# Patient Record
Sex: Female | Born: 1935 | Race: White | Hispanic: No
Health system: Southern US, Community
[De-identification: ages and names within clinical notes are randomized; demographics above are authoritative.]

## PROBLEM LIST (undated history)

## (undated) DIAGNOSIS — I10 Essential (primary) hypertension: Secondary | ICD-10-CM

## (undated) DIAGNOSIS — E785 Hyperlipidemia, unspecified: Secondary | ICD-10-CM

## (undated) DIAGNOSIS — F0280 Dementia in other diseases classified elsewhere without behavioral disturbance: Secondary | ICD-10-CM

## (undated) DIAGNOSIS — G309 Alzheimer's disease, unspecified: Secondary | ICD-10-CM

## (undated) DIAGNOSIS — K219 Gastro-esophageal reflux disease without esophagitis: Secondary | ICD-10-CM

## (undated) DIAGNOSIS — F32A Depression, unspecified: Secondary | ICD-10-CM

## (undated) DIAGNOSIS — F0390 Unspecified dementia without behavioral disturbance: Secondary | ICD-10-CM

## (undated) DIAGNOSIS — F329 Major depressive disorder, single episode, unspecified: Secondary | ICD-10-CM

---

## 2003-03-04 ENCOUNTER — Other Ambulatory Visit: Payer: Self-pay

## 2003-09-02 ENCOUNTER — Other Ambulatory Visit: Payer: Self-pay

## 2004-11-21 ENCOUNTER — Other Ambulatory Visit: Payer: Self-pay

## 2004-11-28 ENCOUNTER — Inpatient Hospital Stay: Payer: Self-pay | Admitting: General Practice

## 2004-12-02 ENCOUNTER — Encounter: Payer: Self-pay | Admitting: Internal Medicine

## 2004-12-29 ENCOUNTER — Encounter: Payer: Self-pay | Admitting: General Practice

## 2005-01-08 ENCOUNTER — Encounter: Payer: Self-pay | Admitting: General Practice

## 2005-02-08 ENCOUNTER — Encounter: Payer: Self-pay | Admitting: General Practice

## 2005-03-16 ENCOUNTER — Ambulatory Visit: Payer: Self-pay | Admitting: General Surgery

## 2005-03-20 ENCOUNTER — Inpatient Hospital Stay: Payer: Self-pay | Admitting: Internal Medicine

## 2005-04-30 ENCOUNTER — Emergency Department: Payer: Self-pay | Admitting: Unknown Physician Specialty

## 2005-06-26 ENCOUNTER — Ambulatory Visit: Payer: Self-pay | Admitting: Gastroenterology

## 2005-07-12 ENCOUNTER — Other Ambulatory Visit: Payer: Self-pay

## 2005-07-25 ENCOUNTER — Inpatient Hospital Stay: Payer: Self-pay | Admitting: General Practice

## 2006-03-20 ENCOUNTER — Ambulatory Visit: Payer: Self-pay | Admitting: General Surgery

## 2006-07-25 ENCOUNTER — Ambulatory Visit: Payer: Self-pay | Admitting: Gastroenterology

## 2006-10-24 ENCOUNTER — Ambulatory Visit: Payer: Self-pay | Admitting: Internal Medicine

## 2007-03-27 ENCOUNTER — Ambulatory Visit: Payer: Self-pay | Admitting: General Surgery

## 2007-05-02 ENCOUNTER — Ambulatory Visit: Payer: Self-pay | Admitting: Internal Medicine

## 2007-07-06 ENCOUNTER — Emergency Department: Payer: Self-pay | Admitting: Emergency Medicine

## 2008-02-18 ENCOUNTER — Ambulatory Visit: Payer: Self-pay | Admitting: Neurology

## 2008-04-28 ENCOUNTER — Ambulatory Visit: Payer: Self-pay | Admitting: General Surgery

## 2009-04-29 ENCOUNTER — Ambulatory Visit: Payer: Self-pay | Admitting: General Surgery

## 2010-05-02 ENCOUNTER — Ambulatory Visit: Payer: Self-pay | Admitting: General Surgery

## 2011-05-04 ENCOUNTER — Ambulatory Visit: Payer: Self-pay | Admitting: General Surgery

## 2012-07-03 ENCOUNTER — Ambulatory Visit: Payer: Self-pay | Admitting: Internal Medicine

## 2012-08-26 ENCOUNTER — Ambulatory Visit: Payer: Self-pay | Admitting: Internal Medicine

## 2012-09-05 ENCOUNTER — Ambulatory Visit: Payer: Self-pay | Admitting: Internal Medicine

## 2012-09-05 LAB — CREATININE, SERUM
Creatinine: 0.82 mg/dL (ref 0.60–1.30)
EGFR (African American): >60
EGFR (Non-African Amer.): >60

## 2013-06-14 ENCOUNTER — Emergency Department: Payer: Self-pay | Admitting: Emergency Medicine

## 2015-04-26 ENCOUNTER — Emergency Department: Payer: Medicare Other

## 2015-04-26 ENCOUNTER — Encounter: Payer: Self-pay | Admitting: Emergency Medicine

## 2015-04-26 ENCOUNTER — Emergency Department
Admission: EM | Admit: 2015-04-26 | Discharge: 2015-04-26 | Disposition: A | Discharge status: Home / Self Care | Payer: Medicare Other | Attending: Emergency Medicine | Admitting: Emergency Medicine

## 2015-04-26 DIAGNOSIS — I10 Essential (primary) hypertension: Secondary | ICD-10-CM | POA: Insufficient documentation

## 2015-04-26 DIAGNOSIS — Y9389 Activity, other specified: Secondary | ICD-10-CM | POA: Insufficient documentation

## 2015-04-26 DIAGNOSIS — G309 Alzheimer's disease, unspecified: Secondary | ICD-10-CM | POA: Insufficient documentation

## 2015-04-26 DIAGNOSIS — W01198A Fall on same level from slipping, tripping and stumbling with subsequent striking against other object, initial encounter: Secondary | ICD-10-CM | POA: Insufficient documentation

## 2015-04-26 DIAGNOSIS — F0280 Dementia in other diseases classified elsewhere without behavioral disturbance: Secondary | ICD-10-CM | POA: Diagnosis not present

## 2015-04-26 DIAGNOSIS — Y998 Other external cause status: Secondary | ICD-10-CM | POA: Diagnosis not present

## 2015-04-26 DIAGNOSIS — Z91040 Latex allergy status: Secondary | ICD-10-CM | POA: Diagnosis not present

## 2015-04-26 DIAGNOSIS — S0990XA Unspecified injury of head, initial encounter: Secondary | ICD-10-CM

## 2015-04-26 DIAGNOSIS — S0081XA Abrasion of other part of head, initial encounter: Secondary | ICD-10-CM | POA: Insufficient documentation

## 2015-04-26 DIAGNOSIS — Y92128 Other place in nursing home as the place of occurrence of the external cause: Secondary | ICD-10-CM | POA: Diagnosis not present

## 2015-04-26 HISTORY — DX: Depression, unspecified: F32.A

## 2015-04-26 HISTORY — DX: Gastro-esophageal reflux disease without esophagitis: K21.9

## 2015-04-26 HISTORY — DX: Hyperlipidemia, unspecified: E78.5

## 2015-04-26 HISTORY — DX: Unspecified dementia, unspecified severity, without behavioral disturbance, psychotic disturbance, mood disturbance, and anxiety: F03.90

## 2015-04-26 HISTORY — DX: Alzheimer's disease, unspecified: G30.9

## 2015-04-26 HISTORY — DX: Essential (primary) hypertension: I10

## 2015-04-26 HISTORY — DX: Major depressive disorder, single episode, unspecified: F32.9

## 2015-04-26 HISTORY — DX: Dementia in other diseases classified elsewhere, unspecified severity, without behavioral disturbance, psychotic disturbance, mood disturbance, and anxiety: F02.80

## 2015-04-26 MED ORDER — HALOPERIDOL LACTATE 5 MG/ML IJ SOLN
5.0000 mg | Freq: Once | INTRAMUSCULAR | Status: DC
  Filled 2015-04-26: qty 1

## 2015-04-26 NOTE — ED Notes (Signed)
Patient to CT Scan via stretcher.  Awake and alert.

## 2015-04-26 NOTE — ED Provider Notes (Signed)
Time Seen: Approximately *1440 I have reviewed the triage notes  Chief Complaint: Fall   History of Present Illness: Rebecca LaoShirley A Hoogendoorn is a 80 y.o. female who presents after a non-syncopal fall. Patient apparently was sitting in the day and and the patient fell likely backwards in a chair and struck the back of her head. The patient had no loss of consciousness and has a significant history of Alzheimer's dementia and is at her baseline behavior. She was joined here by her caretaker and family and advised that her mental status also to them appears at baseline. Patient's not able to exhibit or localize any physical pain. She has noticed abrasion over the back of her head but no other significant injuries on cursory exam   Past Medical History  Diagnosis Date  . Alzheimer's dementia   . Dementia   . Hypertension   . Hyperlipidemia   . GERD (gastroesophageal reflux disease)   . Depression     There are no active problems to display for this patient.   No past surgical history on file.  No past surgical history on file.  No current outpatient prescriptions on file.  Allergies:  Ambien; Lactose intolerance (gi); and Latex  Family History: No family history on file.  Social History: Social History  Substance Use Topics  . Smoking status: Unknown If Ever Smoked  . Smokeless tobacco: Not on file  . Alcohol Use: No     Review of Systems:   10 point review of systems was performed and was otherwise negative: Review of systems was acquired from the patient's family, medical record, and nursing facility Constitutional: No fever Eyes: No visual disturbances ENT: No sore throat, ear pain Cardiac: No chest pain Respiratory: No shortness of breath, wheezing, or stridor Abdomen: No abdominal pain, no vomiting, No diarrhea Endocrine: No weight loss, No night sweats Extremities: No peripheral edema, cyanosis Skin: No rashes, easy bruising Neurologic: No focal weakness, trouble  with speech or swollowing Urologic: No dysuria, Hematuria, or urinary frequency   Physical Exam:  ED Triage Vitals  Enc Vitals Group     BP 04/26/15 1410 118/94 mmHg     Pulse Rate 04/26/15 1410 65     Resp 04/26/15 1410 18     Temp 04/26/15 1410 96.9 F (36.1 C)     Temp Source 04/26/15 1410 Axillary     SpO2 04/26/15 1410 94 %     Weight 04/26/15 1410 120 lb (54.432 kg)     Height 04/26/15 1410 5\' 4"  (1.626 m)     Head Cir --      Peak Flow --      Pain Score --      Pain Loc --      Pain Edu? --      Excl. in GC? --     General: Awake , Alert , and Oriented times 3; GCS 15 Head: Normal cephalic , atraumatic. There is an abrasion over the back of the head with no crepitus or step-off noted or active bleeding at this time Eyes: Pupils equal , round, reactive to light Nose/Throat: No nasal drainage, patent upper airway without erythema or exudate.  Neck: Supple, Full range of motion, No anterior adenopathy or palpable thyroid masses Lungs: Clear to ascultation without wheezes , rhonchi, or rales Heart: Regular rate, regular rhythm without murmurs , gallops , or rubs Abdomen: Soft, non tender without rebound, guarding , or rigidity; bowel sounds positive and symmetric in all 4 quadrants.  No organomegaly .        Extremities: 2 plus symmetric pulses. No edema, clubbing or cyanosis Neurologic: normal ambulation, Motor symmetric without deficits, sensory intact Skin: warm, dry, no rashes    Radiology:   EXAM: CT HEAD WITHOUT CONTRAST  TECHNIQUE: Contiguous axial images were obtained from the base of the skull through the vertex without intravenous contrast.  COMPARISON: 06/14/2013  FINDINGS: Generalized atrophy.  Mild ex vacuo dilatation of the ventricular system.  No midline shift or mass effect.  Small vessel chronic ischemic changes of deep cerebral white matter.  No intracranial hemorrhage, mass lesion, or evidence acute infarction.  No extra-axial fluid  collections.  Fluid versus mucus within RIGHT maxillary sinus.  Remaining visualized paranasal sinuses and mastoid air cells clear.  Bones appear demineralized.  Skull intact.  Focal soft tissue swelling at high posterior LEFT parietal region with foci of soft tissue gas consistent with history of laceration.  IMPRESSION: Atrophy with small vessel chronic ischemic changes of deep cerebral white matter.  No acute intracranial abnormalities.       I personally reviewed the radiologic studies    ED Course: Patient's stay was uneventful on her head CT showed no significant findings of intracranial trauma such as subdural or epidural hematoma. Patient has a mild acute closed head injury and did not require any sutures or any debridement. The patient's son states he can take her back to the nursing facility. They've been advised to return here for condition worsens in especially her mental status    Assessment: * Acute closed head injury  Final Clinical Impression:   Final diagnoses:  Head trauma, initial encounter     Plan: * Outpatient management Patient was advised to return immediately if condition worsens. Patient was advised to follow up with their primary care physician or other specialized physicians involved in their outpatient care             Jennye Moccasin, MD 04/26/15 [phone]

## 2015-04-26 NOTE — ED Notes (Signed)
ARrives from [facility].  S/P unwitnessed fall in the day room  Staff assisted patient to the chair and noticed a laceration to back of head.  Patient has history of Alzheimer Dementia and baseline patient frequently yells out and spits.

## 2015-04-26 NOTE — ED Notes (Signed)
Awake and alert.  NAD.  Skin warm and dry.  D/C back to facility with son.  Report called to facility and discharge papers sent with patient and son.

## 2015-04-26 NOTE — ED Notes (Signed)
Return from CT Scan via stretcher.  

## 2015-04-26 NOTE — Discharge Instructions (Signed)
Concussion, Adult °A concussion, or closed-head injury, is a brain injury caused by a direct blow to the head or by a quick and sudden movement (jolt) of the head or neck. Concussions are usually not life-threatening. Even so, the effects of a concussion can be serious. If you have had a concussion before, you are more likely to experience concussion-like symptoms after a direct blow to the head.  °CAUSES °· Direct blow to the head, such as from running into another player during a soccer game, being hit in a fight, or hitting your head on a hard surface. °· A jolt of the head or neck that causes the brain to move back and forth inside the skull, such as in a car crash. °SIGNS AND SYMPTOMS °The signs of a concussion can be hard to notice. Early on, they may be missed by you, family members, and health care providers. You may look fine but act or feel differently. °Symptoms are usually temporary, but they may last for days, weeks, or even longer. Some symptoms may appear right away while others may not show up for hours or days. Every head injury is different. Symptoms include: °· Mild to moderate headaches that will not go away. °· A feeling of pressure inside your head. °· Having more trouble than usual: °¨ Learning or remembering things you have heard. °¨ Answering questions. °¨ Paying attention or concentrating. °¨ Organizing daily tasks. °¨ Making decisions and solving problems. °· Slowness in thinking, acting or reacting, speaking, or reading. °· Getting lost or being easily confused. °· Feeling tired all the time or lacking energy (fatigued). °· Feeling drowsy. °· Sleep disturbances. °¨ Sleeping more than usual. °¨ Sleeping less than usual. °¨ Trouble falling asleep. °¨ Trouble sleeping (insomnia). °· Loss of balance or feeling lightheaded or dizzy. °· Nausea or vomiting. °· Numbness or tingling. °· Increased sensitivity to: °¨ Sounds. °¨ Lights. °¨ Distractions. °· Vision problems or eyes that tire  easily. °· Diminished sense of taste or smell. °· Ringing in the ears. °· Mood changes such as feeling sad or anxious. °· Becoming easily irritated or angry for little or no reason. °· Lack of motivation. °· Seeing or hearing things other people do not see or hear (hallucinations). °DIAGNOSIS °Your health care provider can usually diagnose a concussion based on a description of your injury and symptoms. He or she will ask whether you passed out (lost consciousness) and whether you are having trouble remembering events that happened right before and during your injury. °Your evaluation might include: °· A brain scan to look for signs of injury to the brain. Even if the test shows no injury, you may still have a concussion. °· Blood tests to be sure other problems are not present. °TREATMENT °· Concussions are usually treated in an emergency department, in urgent care, or at a clinic. You may need to stay in the hospital overnight for further treatment. °· Tell your health care provider if you are taking any medicines, including prescription medicines, over-the-counter medicines, and natural remedies. Some medicines, such as blood thinners (anticoagulants) and aspirin, may increase the chance of complications. Also tell your health care provider whether you have had alcohol or are taking illegal drugs. This information may affect treatment. °· Your health care provider will send you home with important instructions to follow. °· How fast you will recover from a concussion depends on many factors. These factors include how severe your concussion is, what part of your brain was injured,   your age, and how healthy you were before the concussion. °· Most people with mild injuries recover fully. Recovery can take time. In general, recovery is slower in older persons. Also, persons who have had a concussion in the past or have other medical problems may find that it takes longer to recover from their current injury. °HOME  CARE INSTRUCTIONS °General Instructions °· Carefully follow the directions your health care provider gave you. °· Only take over-the-counter or prescription medicines for pain, discomfort, or fever as directed by your health care provider. °· Take only those medicines that your health care provider has approved. °· Do not drink alcohol until your health care provider says you are well enough to do so. Alcohol and certain other drugs may slow your recovery and can put you at risk of further injury. °· If it is harder than usual to remember things, write them down. °· If you are easily distracted, try to do one thing at a time. For example, do not try to watch TV while fixing dinner. °· Talk with family members or close friends when making important decisions. °· Keep all follow-up appointments. Repeated evaluation of your symptoms is recommended for your recovery. °· Watch your symptoms and tell others to do the same. Complications sometimes occur after a concussion. Older adults with a brain injury may have a higher risk of serious complications, such as a blood clot on the brain. °· Tell your teachers, school nurse, school counselor, coach, athletic trainer, or work manager about your injury, symptoms, and restrictions. Tell them about what you can or cannot do. They should watch for: °¨ Increased problems with attention or concentration. °¨ Increased difficulty remembering or learning new information. °¨ Increased time needed to complete tasks or assignments. °¨ Increased irritability or decreased ability to cope with stress. °¨ Increased symptoms. °· Rest. Rest helps the brain to heal. Make sure you: °¨ Get plenty of sleep at night. Avoid staying up late at night. °¨ Keep the same bedtime hours on weekends and weekdays. °¨ Rest during the day. Take daytime naps or rest breaks when you feel tired. °· Limit activities that require a lot of thought or concentration. These include: °¨ Doing homework or job-related  work. °¨ Watching TV. °¨ Working on the computer. °· Avoid any situation where there is potential for another head injury (football, hockey, soccer, basketball, martial arts, downhill snow sports and horseback riding). Your condition will get worse every time you experience a concussion. You should avoid these activities until you are evaluated by the appropriate follow-up health care providers. °Returning To Your Regular Activities °You will need to return to your normal activities slowly, not all at once. You must give your body and brain enough time for recovery. °· Do not return to sports or other athletic activities until your health care provider tells you it is safe to do so. °· Ask your health care provider when you can drive, ride a bicycle, or operate heavy machinery. Your ability to react may be slower after a brain injury. Never do these activities if you are dizzy. °· Ask your health care provider about when you can return to work or school. °Preventing Another Concussion °It is very important to avoid another brain injury, especially before you have recovered. In rare cases, another injury can lead to permanent brain damage, brain swelling, or death. The risk of this is greatest during the first 7-10 days after a head injury. Avoid injuries by: °· Wearing a   seat belt when riding in a car. °· Drinking alcohol only in moderation. °· Wearing a helmet when biking, skiing, skateboarding, skating, or doing similar activities. °· Avoiding activities that could lead to a second concussion, such as contact or recreational sports, until your health care provider says it is okay. °· Taking safety measures in your home. °¨ Remove clutter and tripping hazards from floors and stairways. °¨ Use grab bars in bathrooms and handrails by stairs. °¨ Place non-slip mats on floors and in bathtubs. °¨ Improve lighting in dim areas. °SEEK MEDICAL CARE IF: °· You have increased problems paying attention or  concentrating. °· You have increased difficulty remembering or learning new information. °· You need more time to complete tasks or assignments than before. °· You have increased irritability or decreased ability to cope with stress. °· You have more symptoms than before. °Seek medical care if you have any of the following symptoms for more than 2 weeks after your injury: °· Lasting (chronic) headaches. °· Dizziness or balance problems. °· Nausea. °· Vision problems. °· Increased sensitivity to noise or light. °· Depression or mood swings. °· Anxiety or irritability. °· Memory problems. °· Difficulty concentrating or paying attention. °· Sleep problems. °· Feeling tired all the time. °SEEK IMMEDIATE MEDICAL CARE IF: °· You have severe or worsening headaches. These may be a sign of a blood clot in the brain. °· You have weakness (even if only in one hand, leg, or part of the face). °· You have numbness. °· You have decreased coordination. °· You vomit repeatedly. °· You have increased sleepiness. °· One pupil is larger than the other. °· You have convulsions. °· You have slurred speech. °· You have increased confusion. This may be a sign of a blood clot in the brain. °· You have increased restlessness, agitation, or irritability. °· You are unable to recognize people or places. °· You have neck pain. °· It is difficult to wake you up. °· You have unusual behavior changes. °· You lose consciousness. °MAKE SURE YOU: °· Understand these instructions. °· Will watch your condition. °· Will get help right away if you are not doing well or get worse. °  °This information is not intended to replace advice given to you by your health care provider. Make sure you discuss any questions you have with your health care provider. °  °Document Released: 06/17/2003 Document Revised: 04/17/2014 Document Reviewed: 10/17/2012 °[employer] Interactive Patient Education ©2016 [employer] ° °Please return immediately if condition worsens.  Please contact her primary physician or the physician you were given for referral. If you have any specialist physicians involved in her treatment and plan please also contact them. Thank you for using [facility]. ° °

## 2015-04-26 NOTE — ED Notes (Signed)
Awake and alert. NAD 

## 2015-04-26 NOTE — ED Notes (Signed)
Quarter sized abrasion area to posterior had.  Bleeding controlled.  +1 swelling to area.

## 2015-07-27 ENCOUNTER — Emergency Department
Admission: EM | Admit: 2015-07-27 | Discharge: 2015-07-27 | Disposition: A | Discharge status: Home / Self Care | Payer: Medicare Other | Attending: Emergency Medicine | Admitting: Emergency Medicine

## 2015-07-27 ENCOUNTER — Encounter: Payer: Self-pay | Admitting: Emergency Medicine

## 2015-07-27 ENCOUNTER — Emergency Department: Payer: Medicare Other

## 2015-07-27 DIAGNOSIS — Z23 Encounter for immunization: Secondary | ICD-10-CM | POA: Insufficient documentation

## 2015-07-27 DIAGNOSIS — Z7982 Long term (current) use of aspirin: Secondary | ICD-10-CM | POA: Diagnosis not present

## 2015-07-27 DIAGNOSIS — Y929 Unspecified place or not applicable: Secondary | ICD-10-CM | POA: Diagnosis not present

## 2015-07-27 DIAGNOSIS — E785 Hyperlipidemia, unspecified: Secondary | ICD-10-CM | POA: Insufficient documentation

## 2015-07-27 DIAGNOSIS — I10 Essential (primary) hypertension: Secondary | ICD-10-CM | POA: Diagnosis not present

## 2015-07-27 DIAGNOSIS — Y999 Unspecified external cause status: Secondary | ICD-10-CM | POA: Diagnosis not present

## 2015-07-27 DIAGNOSIS — S0181XA Laceration without foreign body of other part of head, initial encounter: Secondary | ICD-10-CM | POA: Insufficient documentation

## 2015-07-27 DIAGNOSIS — Y939 Activity, unspecified: Secondary | ICD-10-CM | POA: Diagnosis not present

## 2015-07-27 DIAGNOSIS — F0280 Dementia in other diseases classified elsewhere without behavioral disturbance: Secondary | ICD-10-CM | POA: Diagnosis not present

## 2015-07-27 DIAGNOSIS — F329 Major depressive disorder, single episode, unspecified: Secondary | ICD-10-CM | POA: Diagnosis not present

## 2015-07-27 DIAGNOSIS — G309 Alzheimer's disease, unspecified: Secondary | ICD-10-CM | POA: Insufficient documentation

## 2015-07-27 DIAGNOSIS — W19XXXA Unspecified fall, initial encounter: Secondary | ICD-10-CM | POA: Diagnosis not present

## 2015-07-27 MED ORDER — TETANUS-DIPHTH-ACELL PERTUSSIS 5-2.5-18.5 LF-MCG/0.5 IM SUSP
0.5000 mL | Freq: Once | INTRAMUSCULAR | Status: AC
  Administered 2015-07-27: 0.5 mL via INTRAMUSCULAR
  Filled 2015-07-27: qty 0.5

## 2015-07-27 MED ORDER — LIDOCAINE HCL (PF) 1 % IJ SOLN
INTRAMUSCULAR | Status: AC
  Administered 2015-07-27: 5 mL
  Filled 2015-07-27: qty 5

## 2015-07-27 NOTE — Discharge Instructions (Signed)
The forehead laceration was repaired using 3 absorbable sutures.  These will dissolve and fall out on their own in the next 1-2 weeks.  Facial Laceration  A facial laceration is a cut on the face. These injuries can be painful and cause bleeding. Lacerations usually heal quickly, but they need special care to reduce scarring. DIAGNOSIS  Your health care provider will take a medical history, ask for details about how the injury occurred, and examine the wound to determine how deep the cut is. TREATMENT  Some facial lacerations may not require closure. Others may not be able to be closed because of an increased risk of infection. The risk of infection and the chance for successful closure will depend on various factors, including the amount of time since the injury occurred. The wound may be cleaned to help prevent infection. If closure is appropriate, pain medicines may be given if needed. Your health care provider will use stitches (sutures), wound glue (adhesive), or skin adhesive strips to repair the laceration. These tools bring the skin edges together to allow for faster healing and a better cosmetic outcome. If needed, you may also be given a tetanus shot. HOME CARE INSTRUCTIONS  Only take over-the-counter or prescription medicines as directed by your health care provider.  Follow your health care provider's instructions for wound care. These instructions will vary depending on the technique used for closing the wound. For Sutures:  Keep the wound clean and dry.   If you were given a bandage (dressing), you should change it at least once a day. Also change the dressing if it becomes wet or dirty, or as directed by your health care provider.   Wash the wound with soap and water 2 times a day. Rinse the wound off with water to remove all soap. Pat the wound dry with a clean towel.   After cleaning, apply a thin layer of the antibiotic ointment recommended by your health care provider.  This will help prevent infection and keep the dressing from sticking.   You may shower as usual after the first 24 hours. Do not soak the wound in water until the sutures are removed.   Get your sutures removed as directed by your health care provider. With facial lacerations, sutures should usually be taken out after 4-5 days to avoid stitch marks.   Wait a few days after your sutures are removed before applying any makeup. For Skin Adhesive Strips:  Keep the wound clean and dry.   Do not get the skin adhesive strips wet. You may bathe carefully, using caution to keep the wound dry.   If the wound gets wet, pat it dry with a clean towel.   Skin adhesive strips will fall off on their own. You may trim the strips as the wound heals. Do not remove skin adhesive strips that are still stuck to the wound. They will fall off in time.  For Wound Adhesive:  You may briefly wet your wound in the shower or bath. Do not soak or scrub the wound. Do not swim. Avoid periods of heavy sweating until the skin adhesive has fallen off on its own. After showering or bathing, gently pat the wound dry with a clean towel.   Do not apply liquid medicine, cream medicine, ointment medicine, or makeup to your wound while the skin adhesive is in place. This may loosen the film before your wound is healed.   If a dressing is placed over the wound, be careful not  to apply tape directly over the skin adhesive. This may cause the adhesive to be pulled off before the wound is healed.   Avoid prolonged exposure to sunlight or tanning lamps while the skin adhesive is in place.  The skin adhesive will usually remain in place for 5-10 days, then naturally fall off the skin. Do not pick at the adhesive film.  After Healing: Once the wound has healed, cover the wound with sunscreen during the day for 1 full year. This can help minimize scarring. Exposure to ultraviolet light in the first year will darken the scar.  It can take 1-2 years for the scar to lose its redness and to heal completely.  SEEK MEDICAL CARE IF:  You have a fever. SEEK IMMEDIATE MEDICAL CARE IF:  You have redness, pain, or swelling around the wound.   You see ayellowish-white fluid (pus) coming from the wound.    This information is not intended to replace advice given to you by your health care provider. Make sure you discuss any questions you have with your health care provider.   Document Released: 05/04/2004 Document Revised: 04/17/2014 Document Reviewed: 11/07/2012 Elsevier Interactive Patient Education 2016 [employer]  Head Injury, Adult You have a head injury. Headaches and throwing up (vomiting) are common after a head injury. It should be easy to wake up from sleeping. Sometimes you must stay in the hospital. Most problems happen within the first 24 hours. Side effects may occur up to 7-10 days after the injury.  WHAT ARE THE TYPES OF HEAD INJURIES? Head injuries can be as minor as a bump. Some head injuries can be more severe. More severe head injuries include:  A jarring injury to the brain (concussion).  A bruise of the brain (contusion). This mean there is bleeding in the brain that can cause swelling.  A cracked skull (skull fracture).  Bleeding in the brain that collects, clots, and forms a bump (hematoma). WHEN SHOULD I GET HELP RIGHT AWAY?   You are confused or sleepy.  You cannot be woken up.  You feel sick to your stomach (nauseous) or keep throwing up (vomiting).  Your dizziness or unsteadiness is getting worse.  You have very bad, lasting headaches that are not helped by medicine. Take medicines only as told by your doctor.  You cannot use your arms or legs like normal.  You cannot walk.  You notice changes in the black spots in the center of the colored part of your eye (pupil).  You have clear or bloody fluid coming from your nose or ears.  You have trouble seeing. During the next  24 hours after the injury, you must stay with someone who can watch you. This person should get help right away (call 911 in the [place] if you start to shake and are not able to control it (have seizures), you pass out, or you are unable to wake up. HOW CAN I PREVENT A HEAD INJURY IN THE FUTURE?  Wear seat belts.  Wear a helmet while bike riding and playing sports like football.  Stay away from dangerous activities around the house. WHEN CAN I RETURN TO NORMAL ACTIVITIES AND ATHLETICS? See your doctor before doing these activities. You should not do normal activities or play contact sports until 1 week after the following symptoms have stopped:  Headache that does not go away.  Dizziness.  Poor attention.  Confusion.  Memory problems.  Sickness to your stomach or throwing up.  Tiredness.  Fussiness.  Bothered by bright lights or loud noises.  Anxiousness or depression.  Restless sleep. MAKE SURE YOU:   Understand these instructions.  Will watch your condition.  Will get help right away if you are not doing well or get worse.   This information is not intended to replace advice given to you by your health care provider. Make sure you discuss any questions you have with your health care provider.   Document Released: 03/09/2008 Document Revised: 04/17/2014 Document Reviewed: 12/02/2012 Elsevier Interactive Patient Education 2016 Else[employer] Inc[employer]  Sutured Wound Care Sutures are stitches that can be used to close wounds. Taking care of your wound properly can help to prevent pain and infection. It can also help your wound to heal more quickly. HOW TO CARE FOR YOUR SUTURED WOUND Wound Care  Keep the wound clean and dry.  If you were given a bandage (dressing), you should change it at least once per day or as directed by your health care provider. You should also change it if it becomes wet or dirty.  Keep the wound completely dry for the first 24 hours or as directed by  your health care provider. After that time, you may shower or bathe. However, make sure that the wound is not soaked in water until the sutures have been removed.  Clean the wound one time each day or as directed by your health care provider.  Wash the wound with soap and water.  Rinse the wound with water to remove all soap.  Pat the wound dry with a clean towel. Do not rub the wound.  Aftercleaning the wound, apply a thin layer of antibioticointment as directed by your health care provider. This will help to prevent infection and keep the dressing from sticking to the wound.  Have the sutures removed as directed by your health care provider. General Instructions  Take or apply medicines only as directed by your health care provider.  To help prevent scarring, make sure to cover your wound with sunscreen whenever you are outside after the sutures are removed and the wound is healed. Make sure to wear a sunscreen of at least 30 SPF.  If you were prescribed an antibiotic medicine or ointment, finish all of it even if you start to feel better.  Do not scratch or pick at the wound.  Keep all follow-up visits as directed by your health care provider. This is important.  Check your wound every day for signs of infection. Watch for:   Redness, swelling, or pain.  Fluid, blood, or pus.  Raise (elevate) the injured area above the level of your heart while you are sitting or lying down, if possible.  Avoid stretching your wound.  Drink enough fluids to keep your urine clear or pale yellow. SEEK MEDICAL CARE IF:  You received a tetanus shot and you have swelling, severe pain, redness, or bleeding at the injection site.  You have a fever.  A wound that was closed breaks open.  You notice a bad smell coming from the wound.  You notice something coming out of the wound, such as wood or glass.  Your pain is not controlled with medicine.  You have increased redness, swelling, or  pain at the site of your wound.  You have fluid, blood, or pus coming from your wound.  You notice a change in the color of your skin near your wound.  You need to change the dressing frequently due to fluid, blood, or pus draining from  the wound.  You develop a new rash.  You develop numbness around the wound. SEEK IMMEDIATE MEDICAL CARE IF:  You develop severe swelling around the injury site.  Your pain suddenly increases and is severe.  You develop painful lumps near the wound or on skin that is anywhere on your body.  You have a red streak going away from your wound.  The wound is on your hand or foot and you cannot properly move a finger or toe.  The wound is on your hand or foot and you notice that your fingers or toes look pale or bluish.   This information is not intended to replace advice given to you by your health care provider. Make sure you discuss any questions you have with your health care provider.   Document Released: 05/04/2004 Document Revised: 08/11/2014 Document Reviewed: 11/06/2012 Elsevier Interactive Patient Education [employer]

## 2015-07-27 NOTE — ED Provider Notes (Signed)
[facility] Emergency Department Provider Note  ____________________________________________  Time seen: 1:35 AM  I have reviewed the triage vital signs and the nursing notes.   HISTORY  Chief Complaint Fall  Level 5 caveat:  Portions of the history and physical were unable to be obtained due to the patient's chronic dementia  HPI Rebecca Stewart is a 80 y.o. female sent to the ED from [facility] after an unwitnessed fall. The patient had placed a washcloth over the laceration herself by the time staff there arrived. Then to the ED for evaluation. Patient denies any complaints. No chest pain shortness of breath abdominal pain back pain and vomiting fevers chills or dizziness. Does complain of some pain in the area right now. No neck pain.   per EMS report from nursing home, at usual mental status baseline.   Past Medical History  Diagnosis Date  . Alzheimer's dementia   . Dementia   . Hypertension   . Hyperlipidemia   . GERD (gastroesophageal reflux disease)   . Depression      There are no active problems to display for this patient.    History reviewed. No pertinent past surgical history.   Current Outpatient Rx  Name  Route  Sig  Dispense  Refill  . aspirin (ASPIRIN CHILDRENS) 81 MG chewable tablet   Oral   Chew 1 tablet by mouth daily.         . divalproex (DEPAKOTE SPRINKLE) 125 MG capsule   Oral   Take 1 capsule by mouth 2 (two) times daily.         Marland Kitchen. donepezil (ARICEPT) 10 MG tablet   Oral   Take 1 tablet by mouth daily.         . memantine (NAMENDA) 10 MG tablet   Oral   Take 1 tablet by mouth 2 (two) times daily.         . simvastatin (ZOCOR) 20 MG tablet   Oral   Take 1 tablet by mouth daily.            Allergies Ambien; Lactose intolerance (gi); and Latex   History reviewed. No pertinent family history.  Social History Social History  Substance Use Topics  . Smoking status: Unknown If  Ever Smoked  . Smokeless tobacco: None  . Alcohol Use: No    Review of Systems  Constitutional:   No fever or chills.  Eyes:   No vision changes.  Cardiovascular:   No chest pain. Respiratory:   No dyspnea or cough. Gastrointestinal:   Negative for abdominal pain, vomiting and diarrhea.  No bloody stool. Musculoskeletal:   Negative for focal pain or swelling Neurological:   Positive for left forehead pain 10-point ROS otherwise negative.  ____________________________________________   PHYSICAL EXAM:  VITAL SIGNS: ED Triage Vitals  Enc Vitals Group     BP 07/27/15 0138 127/66 mmHg     Pulse Rate 07/27/15 0138 71     Resp 07/27/15 0138 15     Temp 07/27/15 0138 98 F (36.7 C)     Temp Source 07/27/15 0138 Oral     SpO2 07/27/15 0138 99 %     Weight 07/27/15 0138 112 lb (50.803 kg)     Height 07/27/15 0138 5\' 1"  (1.549 m)     Head Cir --      Peak Flow --      Pain Score --      Pain Loc --  Pain Edu? --      Excl. in GC? --     Vital signs reviewed, nursing assessments reviewed.   Constitutional:   Alert and orientedTo self. Well appearing and in no distress. Eyes:   No scleral icterus. No conjunctival pallor. PERRL. EOMI ENT   Head:   Normocephalic with a 3 cm curvilinear laceration over the left forehead, currently hemostatic. No bony tenderness or step-off.   Nose:   No congestion/rhinnorhea. No septal hematoma   Mouth/Throat:   MMM, no pharyngeal erythema. No peritonsillar mass.    Neck:   No stridor. No SubQ emphysema. No meningismus. No midline spinal tenderness, normal range of motion. Hematological/Lymphatic/Immunilogical:   No cervical lymphadenopathy. Cardiovascular:   RRR. Symmetric bilateral radial and DP pulses.  No murmurs.  Respiratory:   Normal respiratory effort without tachypnea nor retractions. Breath sounds are clear and equal bilaterally. No wheezes/rales/rhonchi. Gastrointestinal:   Soft and nontender. Non distended. There is  no CVA tenderness.  No rebound, rigidity, or guarding. Genitourinary:   deferred Musculoskeletal:   Nontender with normal range of motion in all extremities. No joint effusions.  No lower extremity tenderness.  No edema. Neurologic:   Nonverbal CN 2-10 normal. Motor grossly intact. No gross focal neurologic deficits are appreciated.  Skin:    Skin is warm, dry with above noted laceration over the left forehead. Otherwise intact. No rash noted.  No petechiae, purpura, or bullae.  ____________________________________________    LABS (pertinent positives/negatives) (all labs ordered are listed, but only abnormal results are displayed) Labs Reviewed - No data to display ____________________________________________   EKG  Interpreted by me  Date: 07/27/2015  Rate: 66  Rhythm: normal sinus rhythm  QRS Axis: normal  Intervals: normal  ST/T Wave abnormalities: normal  Conduction Disutrbances: none  Narrative Interpretation: unremarkable      ____________________________________________    RADIOLOGY  CT head unremarkable CT cervical spine unremarkable  ____________________________________________   PROCEDURES LACERATION REPAIR Performed by: Scotty Court, Shakeita Vandevander Authorized by: Sharman Cheek Consent: Verbal consent obtained. Risks and benefits: risks, benefits and alternatives were discussed Consent given by: patient Patient identity confirmed: provided demographic data Prepped and Draped in normal sterile fashion Wound explored  Laceration Location: Left forehead  Laceration Length: 3cm  No Foreign Bodies seen or palpated  Anesthesia: local infiltration  Local anesthetic: lidocaine 1% without epinephrine  Anesthetic total: 3 ml  Irrigation method: syringe Amount of cleaning: standard  Skin closure: 5-0 Monocryl   Number of sutures: 3  Technique: Simple interrupted   Patient tolerance: Patient tolerated the procedure well with no immediate  complications.   ____________________________________________   INITIAL IMPRESSION / ASSESSMENT AND PLAN / ED COURSE  Pertinent labs & imaging results that were available during my care of the patient were reviewed by me and considered in my medical decision making (see chart for details).  Patient presents with laceration to left forehead after an unwitnessed fall causing blunt trauma injury. Patient is behaving at her normal vent mental status according to the nursing home report to EMS. No other acute changes, calm and not in distress. CT negative, tdap given. Wound repaired. Follow up with primary care.     ____________________________________________   FINAL CLINICAL IMPRESSION(S) / ED DIAGNOSES  Final diagnoses:  Forehead laceration, initial encounter       Portions of this note were generated with dragon dictation software. Dictation errors may occur despite best attempts at proofreading.   Sharman Cheek, MD 07/27/15 [phone]

## 2015-07-27 NOTE — ED Notes (Signed)
Patient transported to CT 

## 2015-07-27 NOTE — ED Notes (Signed)
Pt returned from CT °

## 2015-07-27 NOTE — ED Notes (Signed)
Pt went to CT

## 2015-07-27 NOTE — ED Notes (Signed)
Pt arrived from [facility] unit by EMS, post unwitnessed fall. EMS reports when they arrived to the facility, facility staff was holding blood soaked washcloth to left forehead laceration. Staff reported to EMS that pt was at baseline.

## 2015-10-04 ENCOUNTER — Emergency Department: Payer: Medicare Other

## 2015-10-04 ENCOUNTER — Emergency Department
Admission: EM | Admit: 2015-10-04 | Discharge: 2015-10-04 | Disposition: A | Discharge status: Home / Self Care | Payer: Medicare Other | Attending: Emergency Medicine | Admitting: Emergency Medicine

## 2015-10-04 DIAGNOSIS — Z7982 Long term (current) use of aspirin: Secondary | ICD-10-CM | POA: Diagnosis not present

## 2015-10-04 DIAGNOSIS — G309 Alzheimer's disease, unspecified: Secondary | ICD-10-CM | POA: Insufficient documentation

## 2015-10-04 DIAGNOSIS — Z048 Encounter for examination and observation for other specified reasons: Secondary | ICD-10-CM | POA: Insufficient documentation

## 2015-10-04 DIAGNOSIS — F329 Major depressive disorder, single episode, unspecified: Secondary | ICD-10-CM | POA: Insufficient documentation

## 2015-10-04 DIAGNOSIS — F0281 Dementia in other diseases classified elsewhere with behavioral disturbance: Secondary | ICD-10-CM | POA: Insufficient documentation

## 2015-10-04 DIAGNOSIS — E785 Hyperlipidemia, unspecified: Secondary | ICD-10-CM | POA: Insufficient documentation

## 2015-10-04 DIAGNOSIS — Z91040 Latex allergy status: Secondary | ICD-10-CM | POA: Diagnosis not present

## 2015-10-04 DIAGNOSIS — I10 Essential (primary) hypertension: Secondary | ICD-10-CM | POA: Diagnosis not present

## 2015-10-04 DIAGNOSIS — Z79899 Other long term (current) drug therapy: Secondary | ICD-10-CM | POA: Insufficient documentation

## 2015-10-04 DIAGNOSIS — W19XXXA Unspecified fall, initial encounter: Secondary | ICD-10-CM

## 2015-10-04 NOTE — ED Notes (Signed)
Patient to ED via EMS for a fall. Patient is a resident of [facility] and is a nonverbal dementia patient. Patient's baseline is such that she just makes noises and unable to communicate her needs or wants. Patient without obvious injury from this fall. EMS reports she fell and possibly struck her head although no obvious injuries are noted.

## 2015-10-04 NOTE — ED Provider Notes (Addendum)
[facility] Emergency Department Provider Note  ____________________________________________    I have reviewed the triage vital signs and the nursing notes.   HISTORY  Chief Complaint Fall  History limited by Alzheimer's dementia  HPI Rebecca Stewart is a 80 y.o. female who presents after a reported fall. Patient is unable to provide history given her severe dementia. Son reports the patient has had nearly 30 falls over the last year. Patient was sent to the ED today because she was found down in  another resident's room with a possible head injury.     Past Medical History  Diagnosis Date  . Alzheimer's dementia   . Dementia   . Hypertension   . Hyperlipidemia   . GERD (gastroesophageal reflux disease)   . Depression     There are no active problems to display for this patient.   History reviewed. No pertinent past surgical history.  Current Outpatient Rx  Name  Route  Sig  Dispense  Refill  . aspirin (ASPIRIN CHILDRENS) 81 MG chewable tablet   Oral   Chew 1 tablet by mouth daily.         . divalproex (DEPAKOTE SPRINKLE) 125 MG capsule   Oral   Take 1 capsule by mouth 2 (two) times daily.         Marland Kitchen. donepezil (ARICEPT) 10 MG tablet   Oral   Take 1 tablet by mouth daily.         . memantine (NAMENDA) 10 MG tablet   Oral   Take 1 tablet by mouth 2 (two) times daily.         . simvastatin (ZOCOR) 20 MG tablet   Oral   Take 1 tablet by mouth daily.           Allergies Ambien; Lactose intolerance (gi); and Latex  No family history on file.  Social History Social History  Substance Use Topics  . Smoking status: Unknown If Ever Smoked  . Smokeless tobacco: None  . Alcohol Use: No    Level V caveat: Unable to obtain Review of Systems due to dementia     ____________________________________________   PHYSICAL EXAM:  VITAL SIGNS: ED Triage Vitals  Enc Vitals Group     BP 10/04/15 0637 91/61 mmHg     Pulse  Rate 10/04/15 0637 65     Resp 10/04/15 0637 20     Temp 10/04/15 0652 98.1 F (36.7 C)     Temp Source 10/04/15 0652 Oral     SpO2 10/04/15 0637 100 %     Weight 10/04/15 0637 120 lb (54.432 kg)     Height 10/04/15 0637 5\' 2"  (1.575 m)     Head Cir --      Peak Flow --      Pain Score --      Pain Loc --      Pain Edu? --      Excl. in GC? --      Constitutional: Alert. Well appearing and in no distress.  Eyes: Conjunctivae are normal. No erythema or injection ENT   Head: Normocephalic. Minor abrasion left anterior forehead no significant swelling   Mouth/Throat: Mucous membranes are moist. Cardiovascular: Normal rate, regular rhythm. Normal and symmetric distal pulses are present in the upper extremities. Respiratory: Normal respiratory effort without tachypnea nor retractions.   Gastrointestinal: Soft and non-tender in all quadrants. No distention. There is no CVA tenderness. Genitourinary: deferred Musculoskeletal: Nontender with normal range of motion  in all extremities. No lower extremity tenderness nor edema. No pain with axial load of both lower extremities, no pelvic tenderness to palpation. Patient may have mild tenderness to palpation of cervical vertebrae although difficult to determine given dementia Neurologic:  Normal speech and language. No gross focal neurologic deficits are appreciated. Skin:  Skin is warm, dry and intact. No rash noted. Psychiatric: Mood and affect are normal. Patient exhibits appropriate insight and judgment.  ____________________________________________    LABS (pertinent positives/negatives)  Labs Reviewed - No data to display  ____________________________________________   EKG  ED ECG REPORT I, Jene EveryKINNER, Atara Paterson, the attending physician, personally viewed and interpreted this ECG.  Date: 10/04/2015 EKG Time: 6:51 AM Rate: 73 Rhythm: normal sinus rhythm QRS Axis: normal Intervals: normal ST/T Wave abnormalities:  nonspecific Conduction Disturbances: none Narrative Interpretation: unremarkable   ____________________________________________    RADIOLOGY    ____________________________________________   PROCEDURES  Procedure(s) performed: none  Critical Care performed: none  ____________________________________________   INITIAL IMPRESSION / ASSESSMENT AND PLAN / ED COURSE  Pertinent labs & imaging results that were available during my care of the patient were reviewed by me and considered in my medical decision making (see chart for details).  Patient with Alzheimer's dementia presents after a fall. Overall her exam is reassuring although possible mild cervical or numbness, we will obtain CT head and cervical spine and reevaluate  ----------------------------------------- 8:55 AM on 10/04/2015 -----------------------------------------  Unable to obtain images given patient movement. Discussed with son, he is comfortable foregoing images given the patient appears to be at her baseline. On reexam she is moving her neck without discomfort  ____________________________________________   FINAL CLINICAL IMPRESSION(S) / ED DIAGNOSES  Final diagnoses:  Fall, initial encounter          Jene Everyobert Boruch Manuele, MD 10/04/15 [phone]  Jene Everyobert Avis Tirone, MD 10/04/15 0900

## 2015-10-04 NOTE — ED Notes (Signed)
Pt going back to nursing home wth son

## 2015-10-04 NOTE — ED Notes (Signed)
Pt shook head yes when asked if she needed to void; assisted x 2 to toilet in room; pt did not void; incontinent of urine; cleaned and dry attends placed on pt; pt hollering; trying to get out of bed by putting legs over the rails; safety sitter at bedside

## 2015-10-16 ENCOUNTER — Emergency Department: Payer: Medicare Other

## 2015-10-16 ENCOUNTER — Encounter: Payer: Self-pay | Admitting: Emergency Medicine

## 2015-10-16 ENCOUNTER — Emergency Department
Admission: EM | Admit: 2015-10-16 | Discharge: 2015-10-16 | Disposition: A | Discharge status: Home / Self Care | Payer: Medicare Other | Attending: Emergency Medicine | Admitting: Emergency Medicine

## 2015-10-16 DIAGNOSIS — S0990XA Unspecified injury of head, initial encounter: Secondary | ICD-10-CM | POA: Insufficient documentation

## 2015-10-16 DIAGNOSIS — Z7982 Long term (current) use of aspirin: Secondary | ICD-10-CM | POA: Diagnosis not present

## 2015-10-16 DIAGNOSIS — I10 Essential (primary) hypertension: Secondary | ICD-10-CM | POA: Diagnosis not present

## 2015-10-16 DIAGNOSIS — Y939 Activity, unspecified: Secondary | ICD-10-CM | POA: Diagnosis not present

## 2015-10-16 DIAGNOSIS — G309 Alzheimer's disease, unspecified: Secondary | ICD-10-CM | POA: Insufficient documentation

## 2015-10-16 DIAGNOSIS — Z79899 Other long term (current) drug therapy: Secondary | ICD-10-CM | POA: Diagnosis not present

## 2015-10-16 DIAGNOSIS — W0110XA Fall on same level from slipping, tripping and stumbling with subsequent striking against unspecified object, initial encounter: Secondary | ICD-10-CM | POA: Insufficient documentation

## 2015-10-16 DIAGNOSIS — Z91040 Latex allergy status: Secondary | ICD-10-CM | POA: Diagnosis not present

## 2015-10-16 DIAGNOSIS — W19XXXA Unspecified fall, initial encounter: Secondary | ICD-10-CM

## 2015-10-16 DIAGNOSIS — F329 Major depressive disorder, single episode, unspecified: Secondary | ICD-10-CM | POA: Insufficient documentation

## 2015-10-16 DIAGNOSIS — Y999 Unspecified external cause status: Secondary | ICD-10-CM | POA: Insufficient documentation

## 2015-10-16 DIAGNOSIS — Y92009 Unspecified place in unspecified non-institutional (private) residence as the place of occurrence of the external cause: Secondary | ICD-10-CM | POA: Diagnosis not present

## 2015-10-16 DIAGNOSIS — E785 Hyperlipidemia, unspecified: Secondary | ICD-10-CM | POA: Diagnosis not present

## 2015-10-16 MED ORDER — LORAZEPAM 2 MG/ML IJ SOLN
1.0000 mg | Freq: Once | INTRAMUSCULAR | Status: AC
  Administered 2015-10-16: 1 mg via INTRAVENOUS
  Filled 2015-10-16: qty 1

## 2015-10-16 NOTE — ED Provider Notes (Signed)
[facility] Emergency Department Provider Note  ____________________________________________  Time seen: Seen upon arrival to the emergency department  I have reviewed the triage vital signs and the nursing notes.   HISTORY  Chief Complaint Fall   HPI Rebecca Stewart is a 80 y.o. female with a history of Alzheimer's dementia as well as hypertensionpresenting to the emergency department today after witnessed fall. Per EMS the staff witnessed the patient tripping and falling. She did hit her head. There was no loss of consciousness. The patient has been at her baseline mental status ever since which is agitated. EMS said that she had spit as well as swung at them en route. The patient's history is very limited by the patient's dementia. Her son is at the bedside and says that she has had multiple falls. Henderson's plan and agrees with plan for CAT scan.   Past Medical History  Diagnosis Date  . Alzheimer's dementia   . Dementia   . Hypertension   . Hyperlipidemia   . GERD (gastroesophageal reflux disease)   . Depression     There are no active problems to display for this patient.   History reviewed. No pertinent past surgical history.  Current Outpatient Rx  Name  Route  Sig  Dispense  Refill  . aspirin (ASPIRIN CHILDRENS) 81 MG chewable tablet   Oral   Chew 1 tablet by mouth daily.         . divalproex (DEPAKOTE SPRINKLE) 125 MG capsule   Oral   Take 1 capsule by mouth 2 (two) times daily.         Marland Kitchen donepezil (ARICEPT) 10 MG tablet   Oral   Take 1 tablet by mouth daily.         . memantine (NAMENDA) 10 MG tablet   Oral   Take 1 tablet by mouth 2 (two) times daily.         . simvastatin (ZOCOR) 20 MG tablet   Oral   Take 1 tablet by mouth daily.           Allergies Ambien; Lactose intolerance (gi); and Latex  No family history on file.  Social History Social History  Substance Use Topics  . Smoking status: Unknown  If Ever Smoked  . Smokeless tobacco: None  . Alcohol Use: No    Review of Systems Caveat secondary to patient with baseline dementia.  ____________________________________________   PHYSICAL EXAM:  VITAL SIGNS: ED Triage Vitals  Enc Vitals Group     BP 10/16/15 1518 198/83 mmHg     Pulse Rate 10/16/15 1518 53     Resp 10/16/15 1518 20     Temp 10/16/15 1518 98.8 F (37.1 C)     Temp Source 10/16/15 1518 Oral     SpO2 10/16/15 1518 99 %     Weight 10/16/15 1518 120 lb (54.432 kg)     Height 10/16/15 1518  (1.651 m)     Head Cir --      Peak Flow --      Pain Score --      Pain Loc --      Pain Edu? --      Excl. in GC? --     Constitutional: Alert.  Agitated. Yelling at staff. Eyes: Conjunctivae are normal. PERRL. EOMI. Head: Atraumatic. Nose: No congestion/rhinnorhea. Mouth/Throat: Mucous membranes are moist.   Neck: No stridor.  No tenderness to the midline C-spine. No deformity or step-off. Ranges head  and neck freely without any apparent pain or restriction. Cardiovascular: Normal rate, regular rhythm. Grossly normal heart sounds.  Respiratory: Normal respiratory effort.  No retractions. Lungs CTAB. Gastrointestinal: Soft and nontender. No distention No CVA tenderness. Musculoskeletal: No lower extremity tenderness nor edema.  No joint effusions. No tenderness to the hips bilaterally. Pelvis is stable. No bruising to the abdomen or chest. No crepitus to the thorax. The thoracic as well as lumbar spines are nontender and there is no deformity or step-off. Neurologic:  Normal speech and language. No gross focal neurologic deficits are appreciated.  Skin:  Skin is warm, dry and intact. No rash noted. Psychiatric: Mood and affect are normal. Speech and behavior are normal.  ____________________________________________   LABS (all labs ordered are listed, but only abnormal results are displayed)  Labs Reviewed - No data to  display ____________________________________________  EKG  ____________________________________________  RADIOLOGY   CT Head Wo Contrast (Final result) Result time: 10/16/15 16:35:06   Final result by Rad Results In Interface (10/16/15 16:35:06)   Narrative:   CLINICAL DATA: Fall.  EXAM: CT HEAD WITHOUT CONTRAST  TECHNIQUE: Contiguous axial images were obtained from the base of the skull through the vertex without intravenous contrast.  COMPARISON: 07/27/2015 head CT.  FINDINGS: No evidence of parenchymal hemorrhage or extra-axial fluid collection. No mass lesion, mass effect, or midline shift.  No CT evidence of acute infarction. Intracranial atherosclerosis. Nonspecific stable moderate subcortical and periventricular white matter hypodensity, most in keeping with chronic small vessel ischemic change.  Generalized cerebral volume loss. Cerebral ventricle sizes are stable and concordant with the degree of cerebral volume loss.  The visualized paranasal sinuses are essentially clear. The mastoid air cells are unopacified. No evidence of calvarial fracture.  IMPRESSION: 1. No evidence of acute intracranial abnormality. No evidence of calvarial fracture. 2. Generalized cerebral volume loss and moderate chronic small vessel ischemia.   Electronically Signed By: Delbert PhenixJason A Poff M.D. On: 10/16/2015 16:35       ____________________________________________   PROCEDURES   Procedures  ____________________________________________   INITIAL IMPRESSION / ASSESSMENT AND PLAN / ED COURSE  Pertinent labs & imaging results that were available during my care of the patient were reviewed by me and considered in my medical decision making (see chart for details).  ----------------------------------------- 5:52 PM on 10/16/2015 -----------------------------------------  Patient is calm and cooperative after Ativan. Reassuring head CT. Will be discharged  home. Son is the bedside And he is aware of the CAT scan results. He says that he will be able to transport the patient himself. ____________________________________________   FINAL CLINICAL IMPRESSION(S) / ED DIAGNOSES  Mechanical fall. Head injury.    NEW MEDICATIONS STARTED DURING THIS VISIT:  New Prescriptions   No medications on file     Note:  This document was prepared using Dragon voice recognition software and may include unintentional dictation errors.    Myrna Blazeravid Matthew Gagandeep Pettet, MD 10/16/15 [phone]

## 2015-10-16 NOTE — ED Notes (Signed)
Pt arrived alert , confused and spitting on ems.  Reports staff at care home witnessed pt fall.  Denies loc. States pts behavior is at baseline.

## 2015-11-23 ENCOUNTER — Encounter: Payer: Self-pay | Admitting: Emergency Medicine

## 2015-11-23 ENCOUNTER — Emergency Department
Admission: EM | Admit: 2015-11-23 | Discharge: 2015-11-23 | Disposition: A | Discharge status: Home / Self Care | Payer: Medicare Other | Attending: Emergency Medicine | Admitting: Emergency Medicine

## 2015-11-23 ENCOUNTER — Emergency Department: Payer: Medicare Other

## 2015-11-23 DIAGNOSIS — S0990XA Unspecified injury of head, initial encounter: Secondary | ICD-10-CM

## 2015-11-23 DIAGNOSIS — Y929 Unspecified place or not applicable: Secondary | ICD-10-CM | POA: Insufficient documentation

## 2015-11-23 DIAGNOSIS — I10 Essential (primary) hypertension: Secondary | ICD-10-CM | POA: Diagnosis not present

## 2015-11-23 DIAGNOSIS — W1839XA Other fall on same level, initial encounter: Secondary | ICD-10-CM | POA: Diagnosis not present

## 2015-11-23 DIAGNOSIS — Y999 Unspecified external cause status: Secondary | ICD-10-CM | POA: Diagnosis not present

## 2015-11-23 DIAGNOSIS — Y939 Activity, unspecified: Secondary | ICD-10-CM | POA: Insufficient documentation

## 2015-11-23 DIAGNOSIS — Z7982 Long term (current) use of aspirin: Secondary | ICD-10-CM | POA: Insufficient documentation

## 2015-11-23 DIAGNOSIS — S0083XA Contusion of other part of head, initial encounter: Secondary | ICD-10-CM | POA: Insufficient documentation

## 2015-11-23 MED ORDER — LORAZEPAM 2 MG/ML IJ SOLN
INTRAMUSCULAR | Status: AC
  Administered 2015-11-23: 1 mg via INTRAVENOUS
  Filled 2015-11-23: qty 1

## 2015-11-23 MED ORDER — LORAZEPAM 2 MG/ML IJ SOLN
1.0000 mg | Freq: Once | INTRAMUSCULAR | Status: AC
  Administered 2015-11-23: 1 mg via INTRAVENOUS

## 2015-11-23 NOTE — ED Notes (Signed)
Patient transported to CT 

## 2015-11-23 NOTE — ED Triage Notes (Signed)
Pt arrived via ems from [facility] from a fall where pt hit right forehead on a rug. Pt was ambulatory for ems and walked to stretcher. Pt has a history of dementia and ems report from facility of being non verbal. Small area to right forehead with small amount of controlled bleeding.

## 2015-11-23 NOTE — ED Notes (Signed)
Awaiting for MD to assess Pt.

## 2015-11-23 NOTE — ED Notes (Signed)
MD at bedside. Sons at the bedside.

## 2015-11-23 NOTE — Discharge Instructions (Signed)
Please return immediately if condition worsens. Please contact her primary physician or the physician you were given for referral. If you have any specialist physicians involved in her treatment and plan please also contact them. Thank you for using [facility]. ° °

## 2015-11-23 NOTE — ED Notes (Addendum)
Pt yelling out thrashing around will not stay still to get Head CT Md ordering meds to help relax her to be able to get CT. SON aware and at the bedside. Pt noted to have small abrasion on right side of forehead. Pt noted to have no ROM issues.

## 2015-11-24 NOTE — ED Provider Notes (Signed)
Time Seen: Approximately 2052  I have reviewed the triage notes  Chief Complaint: Fall   History of Present Illness: Rebecca Stewart is a 80 y.o. female who was transported here by ambulance from [facility]. Patient had a non-syncopal fall and landed on a rug. There was no loss of consciousness. She has a mild abrasion above her right eye with no active bleeding at this time. According to her son  she is at her baseline mental status.   Past Medical History:  Diagnosis Date  . Alzheimer's dementia   . Dementia   . Depression   . GERD (gastroesophageal reflux disease)   . Hyperlipidemia   . Hypertension     There are no active problems to display for this patient.   No past surgical history on file.  No past surgical history on file.  Current Outpatient Rx  . Order #: [id number] Class: Historical Med  . Order #: [id number] Class: Historical Med  . Order #: [id number] Class: Historical Med  . Order #: [id number] Class: Historical Med  . Order #: [id number] Class: Historical Med    Allergies:  Ambien [zolpidem tartrate]; Lactose intolerance (gi); and Latex  Family History: No family history on file.  Social History: Social History  Substance Use Topics  . Smoking status: Unknown If Ever Smoked  . Smokeless tobacco: Not on file  . Alcohol use No     Review of Systems:   10 point review of systems was performed and was otherwise negative: Review of systems is taken through her sign, EMS, and previous documentation Constitutional: No fever Eyes: No visual disturbances ENT: No sore throat, ear pain Cardiac: No chest pain Respiratory: No shortness of breath, wheezing, or stridor Abdomen: No abdominal pain, no vomiting, No diarrhea Endocrine: No weight loss, No night sweats Extremities: No peripheral edema, cyanosis Skin: No rashes, easy bruising Neurologic: No focal weakness, trouble with speech or swollowing Urologic: No dysuria, Hematuria, or  urinary frequency No neck pain  Physical Exam:  ED Triage Vitals  Enc Vitals Group     BP 11/23/15 2130 (!) 132/96     Pulse Rate 11/23/15 2056 70     Resp 11/23/15 2056 16     Temp 11/23/15 2056 97.9 F (36.6 C)     Temp Source 11/23/15 2056 Axillary     SpO2 11/23/15 2056 95 %     Weight 11/23/15 2052 120 lb (54.4 kg)     Height 11/23/15 2052 4\' 11"  (1.499 m)     Head Circumference --      Peak Flow --      Pain Score --      Pain Loc --      Pain Edu? --      Excl. in GC? --     General: Awake , AlertTo name only on occasion ,Combative at times, GCS of 15  Head: Small abrasion above her right eye on the forehead region with a small hematoma, no crepitus or step-off is noted  Eyes: Pupils equal , round, reactive to light. Extraocular eye movements are intact Nose/Throat: No nasal drainage, patent upper airway without erythema or exudate.  Neck: Supple, Full range of motion, no posterior reproducible pain with full range of motion No anterior adenopathy or palpable thyroid masses Lungs: Clear to ascultation without wheezes , rhonchi, or rales Heart: Regular rate, regular rhythm without murmurs , gallops , or rubs Abdomen: Soft, non tender without rebound, guarding , or rigidity; bowel sounds positive and  symmetric in all 4 quadrants. No organomegaly .        Extremities: 2 plus symmetric pulses. No edema, clubbing or cyanosis Neurologic: normal ambulation, Motor symmetric without deficits, sensory intact Skin: warm, dry, no rashes   EKG: * ED ECG REPORT I, Jennye MoccasinBrian S Quigley, the attending physician, personally viewed and interpreted this ECG.  Date: 11/24/2015 EKG Time: 2058 Rate: 75 Rhythm: normal sinus rhythm with a large amount of artifact QRS Axis: normal Intervals: normal ST/T Wave abnormalities: normal Conduction Disturbances: none Narrative Interpretation: unremarkable No acute ischemic changes noted   Radiology:  "Ct Head Wo Contrast  Result Date:  11/23/2015 CLINICAL DATA:  Fall, striking right forehead on a rug. History of dementia. Nonverbal. EXAM: CT HEAD WITHOUT CONTRAST TECHNIQUE: Contiguous axial images were obtained from the base of the skull through the vertex without intravenous contrast. COMPARISON:  10/16/2015 FINDINGS: Brain: Diffuse cerebral atrophy. Ventricular dilatation consistent with central atrophy. Patchy low-attenuation changes in the deep white matter consistent small vessel ischemia. No mass effect or midline shift. No abnormal extra-axial fluid collections. Gray-white matter junctions are distinct. Basal cisterns are not effaced. No evidence of acute intracranial hemorrhage. Vascular: No hyperdense vessel or unexpected calcification. Skull: Calvarium appears intact. Sinuses/Orbits: No acute finding. Other: Congenital nonunion of the posterior arch of C1. IMPRESSION: No acute intracranial abnormalities. Chronic atrophy and small vessel ischemic changes. Electronically Signed   By: Burman NievesWilliam  Stevens M.D.   On: 11/23/2015 22:28  "  * I personally reviewed the radiologic studies     ED Course:  Patient's stay was uneventful and she did not appear to have any significant injury from a non-syncopal fall. Patient be transported back by private vehicle as his son states that he feels confident he can transported uneventfully. She was given Ativan 1 mg IM for sedation so that we could undergo the CAT scan evaluation Clinical Course      Final Clinical Impression: *  Final diagnoses:  Head injury, initial encounter     Plan: * Outpatient Patient was advised to return immediately if condition worsens. Patient was advised to follow up with their primary care physician or other specialized physicians involved in their outpatient care. The patient and/or family member/power of attorney had laboratory results reviewed at the bedside. All questions and concerns were addressed and appropriate discharge instructions were  distributed by the nursing staff.             Jennye MoccasinBrian S Quigley, MD 11/24/15 [phone]

## 2016-01-06 ENCOUNTER — Encounter: Payer: Self-pay | Admitting: Emergency Medicine

## 2016-01-06 ENCOUNTER — Emergency Department
Admission: EM | Admit: 2016-01-06 | Discharge: 2016-01-06 | Disposition: A | Discharge status: Home / Self Care | Payer: Medicare Other | Attending: Emergency Medicine | Admitting: Emergency Medicine

## 2016-01-06 ENCOUNTER — Emergency Department: Payer: Medicare Other

## 2016-01-06 DIAGNOSIS — R4182 Altered mental status, unspecified: Secondary | ICD-10-CM | POA: Insufficient documentation

## 2016-01-06 DIAGNOSIS — Z79899 Other long term (current) drug therapy: Secondary | ICD-10-CM | POA: Diagnosis not present

## 2016-01-06 DIAGNOSIS — F0281 Dementia in other diseases classified elsewhere with behavioral disturbance: Secondary | ICD-10-CM | POA: Diagnosis not present

## 2016-01-06 DIAGNOSIS — Z91040 Latex allergy status: Secondary | ICD-10-CM | POA: Diagnosis not present

## 2016-01-06 DIAGNOSIS — Z7982 Long term (current) use of aspirin: Secondary | ICD-10-CM | POA: Insufficient documentation

## 2016-01-06 DIAGNOSIS — I10 Essential (primary) hypertension: Secondary | ICD-10-CM | POA: Diagnosis not present

## 2016-01-06 DIAGNOSIS — G309 Alzheimer's disease, unspecified: Secondary | ICD-10-CM | POA: Diagnosis not present

## 2016-01-06 DIAGNOSIS — M79602 Pain in left arm: Secondary | ICD-10-CM | POA: Diagnosis present

## 2016-01-06 LAB — URINALYSIS COMPLETE WITH MICROSCOPIC (ARMC ONLY)
BACTERIA UA: NONE SEEN
Bilirubin Urine: NEGATIVE
Glucose, UA: 50 mg/dL — AB
Hgb urine dipstick: NEGATIVE
Leukocytes, UA: NEGATIVE
Nitrite: NEGATIVE
PH: 6 (ref 5.0–8.0)
PROTEIN: NEGATIVE mg/dL
SPECIFIC GRAVITY, URINE: 1.016 (ref 1.005–1.030)
Squamous Epithelial / LPF: NONE SEEN

## 2016-01-06 LAB — BASIC METABOLIC PANEL
Anion gap: 10 (ref 5–15)
BUN: 23 mg/dL — AB (ref 6–20)
CHLORIDE: 106 mmol/L (ref 101–111)
CO2: 24 mmol/L (ref 22–32)
CREATININE: 0.53 mg/dL (ref 0.44–1.00)
Calcium: 9.4 mg/dL (ref 8.9–10.3)
GFR calc Af Amer: >60 mL/min (ref >60)
GFR calc non Af Amer: >60 mL/min (ref >60)
Glucose, Bld: 104 mg/dL — ABNORMAL HIGH (ref 65–99)
Potassium: 3.8 mmol/L (ref 3.5–5.1)
SODIUM: 140 mmol/L (ref 135–145)

## 2016-01-06 LAB — CBC WITH DIFFERENTIAL/PLATELET
Basophils Absolute: 0 10*3/uL (ref 0–0.1)
Basophils Relative: 0 %
EOS ABS: 0 10*3/uL (ref 0–0.7)
EOS PCT: 0 %
HCT: 40 % (ref 35.0–47.0)
HEMOGLOBIN: 14.1 g/dL (ref 12.0–16.0)
LYMPHS ABS: 1.5 10*3/uL (ref 1.0–3.6)
Lymphocytes Relative: 17 %
MCH: 29.4 pg (ref 26.0–34.0)
MCHC: 35.3 g/dL (ref 32.0–36.0)
MCV: 83.3 fL (ref 80.0–100.0)
MONO ABS: 0.6 10*3/uL (ref 0.2–0.9)
MONOS PCT: 7 %
NEUTROS PCT: 76 %
Neutro Abs: 6.7 10*3/uL — ABNORMAL HIGH (ref 1.4–6.5)
Platelets: 310 10*3/uL (ref 150–440)
RBC: 4.8 MIL/uL (ref 3.80–5.20)
RDW: 14.5 % (ref 11.5–14.5)
WBC: 8.8 10*3/uL (ref 3.6–11.0)

## 2016-01-06 LAB — TROPONIN I: Troponin I: <0.03 ng/mL (ref <0.03)

## 2016-01-06 MED ORDER — ACETAMINOPHEN 650 MG RE SUPP
650.0000 mg | Freq: Once | RECTAL | Status: AC
  Administered 2016-01-06: 650 mg via RECTAL
  Filled 2016-01-06: qty 1

## 2016-01-06 MED ORDER — ACETAMINOPHEN 325 MG PO TABS: 650.0000 mg | ORAL_TABLET | Freq: Four times a day (QID) | ORAL | 2 refills | Status: DC | PRN

## 2016-01-06 MED ORDER — ACETAMINOPHEN 325 MG PO TABS: 1000.0000 mg | ORAL_TABLET | Freq: Three times a day (TID) | ORAL | 2 refills | Status: AC

## 2016-01-06 NOTE — ED Provider Notes (Addendum)
[facility] Emergency Department Provider Note  ____________________________________________  Time seen: Approximately 12:53 PM  I have reviewed the triage vital signs and the nursing notes.   HISTORY  Chief Complaint Altered Mental Status  Level 5 caveat:  Portions of the history and physical were unable to be obtained due to dementia   HPI Rebecca Stewart is a 80 y.o. female with a history of advanced Alzheimer's dementia, hypertension, hyperlipidemia who presents for evaluation of altered mental status and left arm pain. According to the facility and patient's son, she woke up yesterday with her arm flexed against her body and since yesterday patient has kept her left arm in the same position and she screams every time the arm is moved. She is also more agitated and not allowing staff to change her over to clean her without becoming very agitated. No known falls or trauma, patient is most bed bound for the last few weeks due to slow decline in her dementia. Patient is unable to provide any history and mumbles and groans and screams which per staff is her baseline. Patient is here with her son also tells me the patient is currently at her baseline at this time other than the contracted left arm.  Past Medical History:  Diagnosis Date  . Alzheimer's dementia   . Dementia   . Depression   . GERD (gastroesophageal reflux disease)   . Hyperlipidemia   . Hypertension     There are no active problems to display for this patient.   History reviewed. No pertinent surgical history.  Prior to Admission medications   Medication Sig Start Date End Date Taking? Authorizing Provider  acetaminophen (TYLENOL) 325 MG tablet Take 3 tablets (975 mg total) by mouth 3 (three) times daily. 01/06/16 01/05/17  Nita Sicklearolina Benjaman Artman, MD  aspirin (ASPIRIN CHILDRENS) 81 MG chewable tablet Chew 1 tablet by mouth daily.    Historical Provider, MD  divalproex (DEPAKOTE SPRINKLE) 125  MG capsule Take 1 capsule by mouth 2 (two) times daily. 07/20/15   Historical Provider, MD  donepezil (ARICEPT) 10 MG tablet Take 1 tablet by mouth daily. 07/20/15   Historical Provider, MD  memantine (NAMENDA) 10 MG tablet Take 1 tablet by mouth 2 (two) times daily. 07/20/15   Historical Provider, MD  simvastatin (ZOCOR) 20 MG tablet Take 1 tablet by mouth daily. 07/20/15   Historical Provider, MD    Allergies Ambien [zolpidem tartrate]; Lactose intolerance (gi); Latex; Morphine and related; and Penicillins  No family history on file.  Social History Social History  Substance Use Topics  . Smoking status: Unknown If Ever Smoked  . Smokeless tobacco: Not on file  . Alcohol use No    Review of Systems + L arm pain Unable to obtain rest due to dementia ____________________________________________   PHYSICAL EXAM:  VITAL SIGNS: ED Triage Vitals [01/06/16 1248]  Enc Vitals Group     BP (!) 152/117     Pulse Rate 69     Resp 20     Temp 97.4 F (36.3 C)     Temp Source Axillary     SpO2 99 %     Weight      Height      Head Circumference      Peak Flow      Pain Score      Pain Loc      Pain Edu?      Excl. in GC?     Constitutional: Patient is  awake and screaming which per son is her baseline. HEENT:      Head: Normocephalic and atraumatic.         Eyes: Conjunctivae are normal. Sclera is non-icteric. PERRL      Mouth/Throat: Mucous membranes are dry.       Neck: Supple with no signs of meningismus. Cardiovascular: Regular rate and rhythm. No murmurs, gallops, or rubs. 2+ symmetrical distal pulses are present in all extremities. No JVD. Respiratory: Normal respiratory effort. Lungs are clear to auscultation bilaterally. No wheezes, crackles, or rhonchi.  Gastrointestinal: Soft, non tender, and non distended with positive bowel sounds. No rebound or guarding. Musculoskeletal: Patient is holding her left arm flexed against her chest wall and will fight me when I try to  extend the arm at the elbow joint. No obvious injuries, deformities, abrasions, swelling, or erythema noted. Neurologic: Face is symmetric, moves all 4 extremities. Does not follow commands Skin: Skin is warm, dry and intact. No rash noted.  ____________________________________________   LABS (all labs ordered are listed, but only abnormal results are displayed)  Labs Reviewed  CBC WITH DIFFERENTIAL/PLATELET - Abnormal; Notable for the following:       Result Value   Neutro Abs 6.7 (*)    All other components within normal limits  BASIC METABOLIC PANEL - Abnormal; Notable for the following:    Glucose, Bld 104 (*)    BUN 23 (*)    All other components within normal limits  URINALYSIS COMPLETEWITH MICROSCOPIC ([facility] ONLY) - Abnormal; Notable for the following:    Color, Urine YELLOW (*)    APPearance CLEAR (*)    Glucose, UA 50 (*)    Ketones, ur 1+ (*)    All other components within normal limits  URINE CULTURE  TROPONIN I   ____________________________________________  EKG  ED ECG REPORT I, Nita Sickle, the attending physician, personally viewed and interpreted this ECG.  Normal sinus rhythm, rate 72, poor quality EKG as patient will not stay still but no obvious STE or depressions seen.  ____________________________________________  RADIOLOGY  XR L arm: No acute fracture or subluxation. Mild degenerative changes ____________________________________________   PROCEDURES  Procedure(s) performed: None Procedures Critical Care performed:  None ____________________________________________   INITIAL IMPRESSION / ASSESSMENT AND PLAN / ED COURSE   80 y.o. female with a history of advanced Alzheimer's dementia, hypertension, hyperlipidemia who presents for evaluation of left arm pain since yesterday. Patient is keeping her arm contracted close to her torso and will fight and scream when we tried extend the arm at the elbow. No obvious injuries, swelling, erythema  of the left upper extremity. She is moving the other 3 extremities and face is symmetric with no obvious evidence of stroke. vital signs are within normal limits with no fever. Since patient is demented and is unable to provide further history will check blood work, urinalysis, EKG, get an x-ray of her left upper extremity to rule out any fractures. According to patient's son patient is at her baseline mental status at this time.  Clinical Course  Comment By Time  Labs, EKG, urinalysis, and x-ray with no acute findings. Patient be discharged back home to her skilled nursing facility. Nita Sickle, MD 09/28 [phone]   Patient is more comfortable and continues to keep arm close to body after tylenol but no longer screaming and allows me to gently move the arm. This is possibly due to MSK pain of the arm as XRs are negative. No evidence of joint effusion  or cellulitis. DC on standing tylenol for pain.  Pertinent labs & imaging results that were available during my care of the patient were reviewed by me and considered in my medical decision making (see chart for details).    ____________________________________________   FINAL CLINICAL IMPRESSION(S) / ED DIAGNOSES  Final diagnoses:  Left arm pain      NEW MEDICATIONS STARTED DURING THIS VISIT:  Current Discharge Medication List    START taking these medications   Details  acetaminophen (TYLENOL) 325 MG tablet Take 3 tablets (975 mg total) by mouth 3 (three) times daily. Qty: 100 tablet, Refills: 2         Note:  This document was prepared using Dragon voice recognition software and may include unintentional dictation errors.    Nita Sickle, MD 01/06/16 1522    Nita Sickle, MD 01/06/16 [phone]

## 2016-01-06 NOTE — Discharge Instructions (Signed)
Give tylenol 1000mg  every 8 hours for arm pain.

## 2016-01-06 NOTE — ED Notes (Signed)
Spoke to pt's daughter-in-law about transport for pt. She is waiting for her husband to come back to answer question since he was saying he wanted to transport her home.

## 2016-01-06 NOTE — ED Triage Notes (Signed)
Pt here from [facility], facility reports pt is currently at baseline, confused and yelling. Facility reports that pt was acting different earlier today, was calm, cooperative and able to answer questions.

## 2016-01-07 LAB — URINE CULTURE: Culture: NO GROWTH

## 2016-02-09 DEATH — deceased

## 2017-05-20 IMAGING — CT CT CERVICAL SPINE W/O CM
1 of 7 series · 3 of 14 positions shown, 4 images · non-contrast
Comparison: CT head 04/26/2015. CT head and cervical spine
06/14/2013.

CLINICAL DATA: Unwitnessed fall. Laceration to the left forehead.
Headache.

EXAM:
CT HEAD WITHOUT CONTRAST
CT CERVICAL SPINE WITHOUT CONTRAST
TECHNIQUE: Multidetector CT imaging of the head and cervical spine was
performed following the standard protocol without intravenous
contrast. Multiplanar CT image reconstructions of the cervical spine
were also generated.

[Series 3: bone · axial · 0.47mm/px · z∈[-198,-30]mm · 3 of 85 slices shown, 4 images]
[im 1/85  soft-tissue]
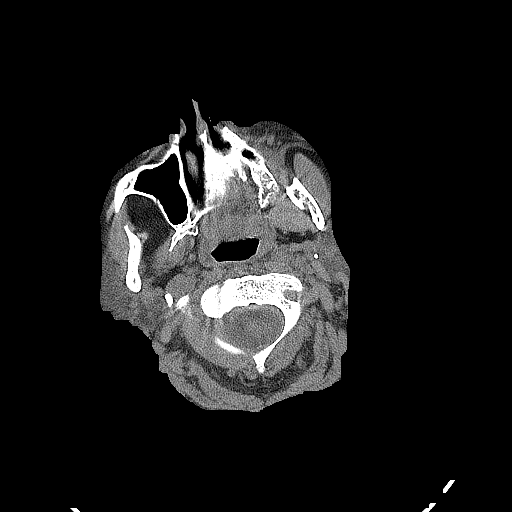
[im 1/85  bone]
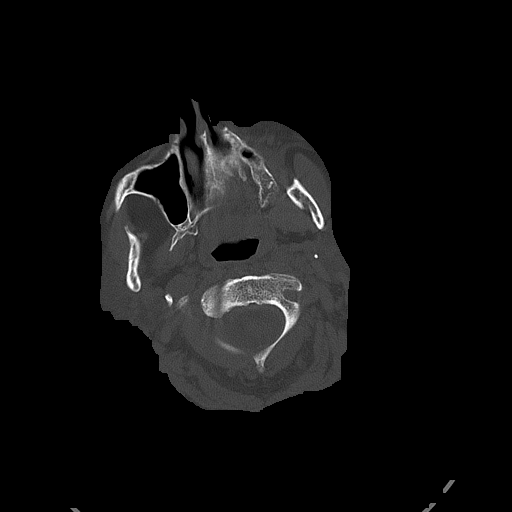
[im 43/85  bone]
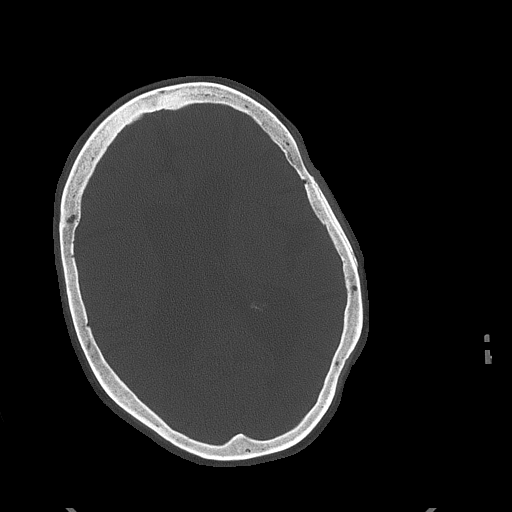
[im 85/85  bone]
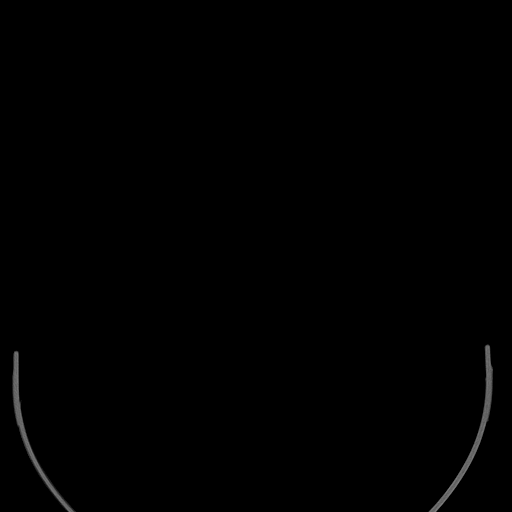

[3 of 14 positions shown; findings below may reference images not displayed]

FINDINGS: CT HEAD FINDINGS

Diffuse cerebral atrophy. Ventricular dilatation consistent with
central atrophy. Low-attenuation changes in the deep white matter
likely representing small vessel ischemia. No mass effect or midline
shift. No abnormal extra-axial fluid collections. Gray-white matter
junctions are distinct. Basal cisterns are not effaced. No evidence
of acute intracranial hemorrhage. No depressed skull fractures.
Visualized paranasal sinuses and mastoid air cells are not
opacified. Vascular calcifications.

CT CERVICAL SPINE FINDINGS

Normal curvature of the cervical spine. Slight retrolisthesis of C2
on C3 with slight anterolisthesis of C7 on T1. Changes are likely
degenerative. There are diffuse degenerative changes throughout
cervical spine with narrowed interspaces and endplate hypertrophic
changes. Degenerative changes throughout the cervical facet joints.
No vertebral compression deformities. C1-2 articulation appears
intact. No focal bone lesion or bone destruction. Congenital
nonunion of the posterior arch of C1. No prevertebral soft tissue
swelling. Soft tissues are unremarkable.
IMPRESSION: No acute intracranial abnormalities. Diffuse atrophy and small
vessel ischemic change.

Degenerative changes in the cervical spine. No acute displaced
fractures identified.

## 2017-10-30 IMAGING — CR DG HUMERUS 2V *L*
2 series · 2 of 2 positions shown · non-contrast
Comparison: 06/14/2013

CLINICAL DATA: Pain, dementia

EXAM:
LEFT HUMERUS - 2+ VIEW

[humerus ap]
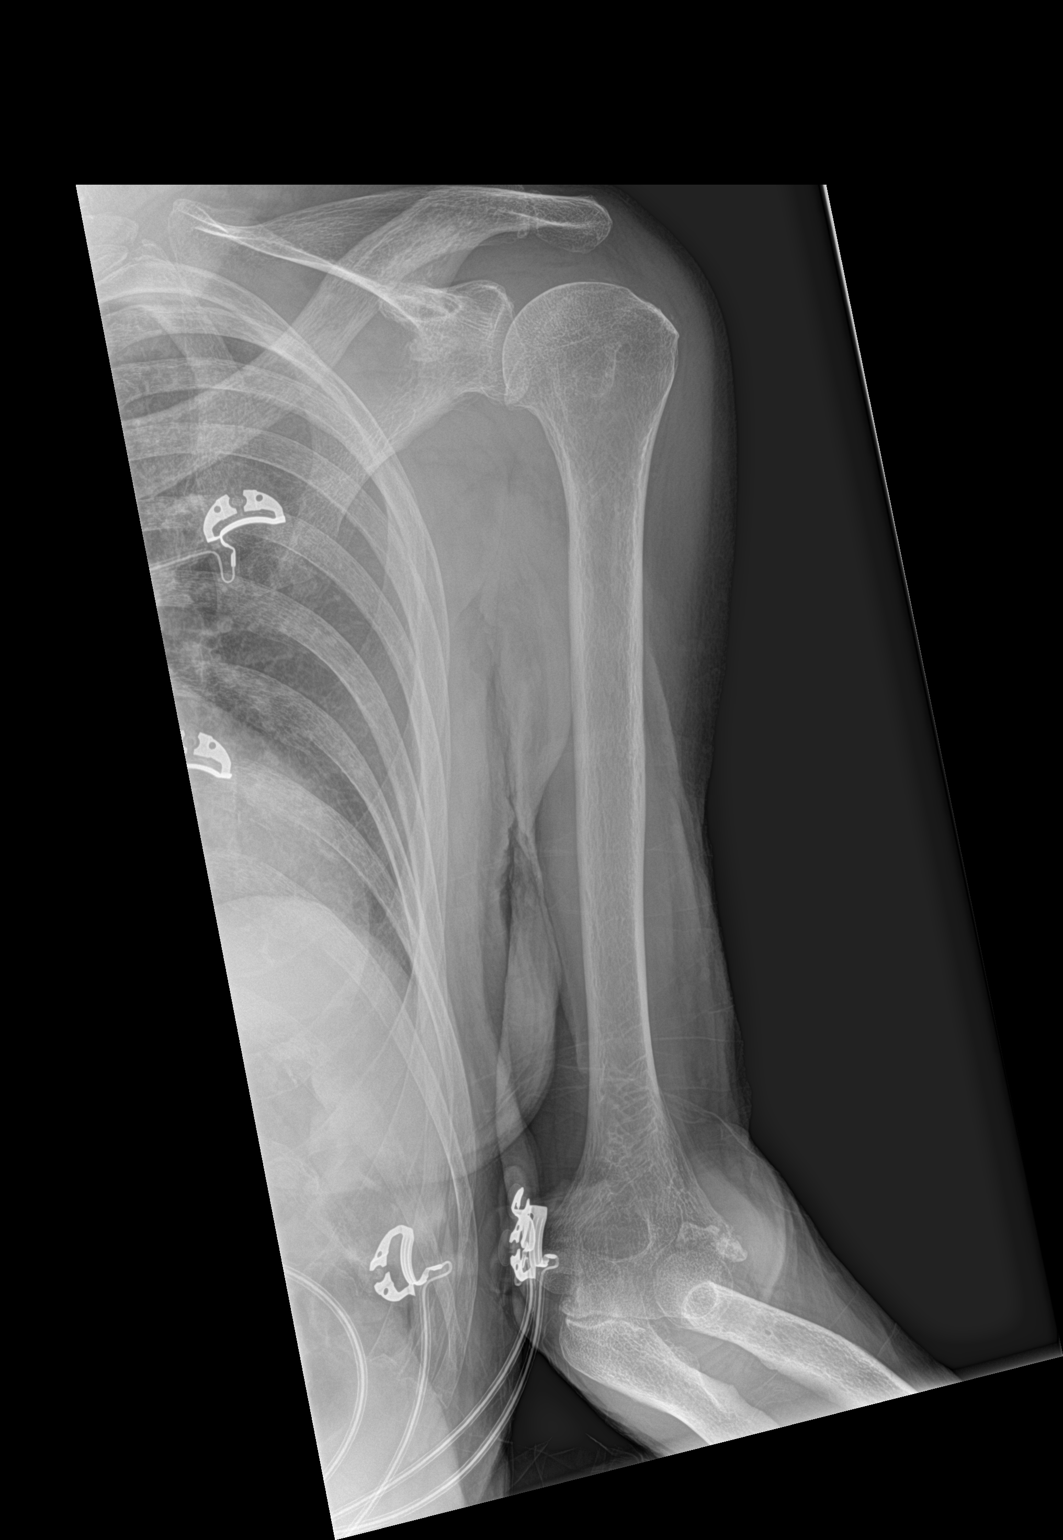

[humerus lat]
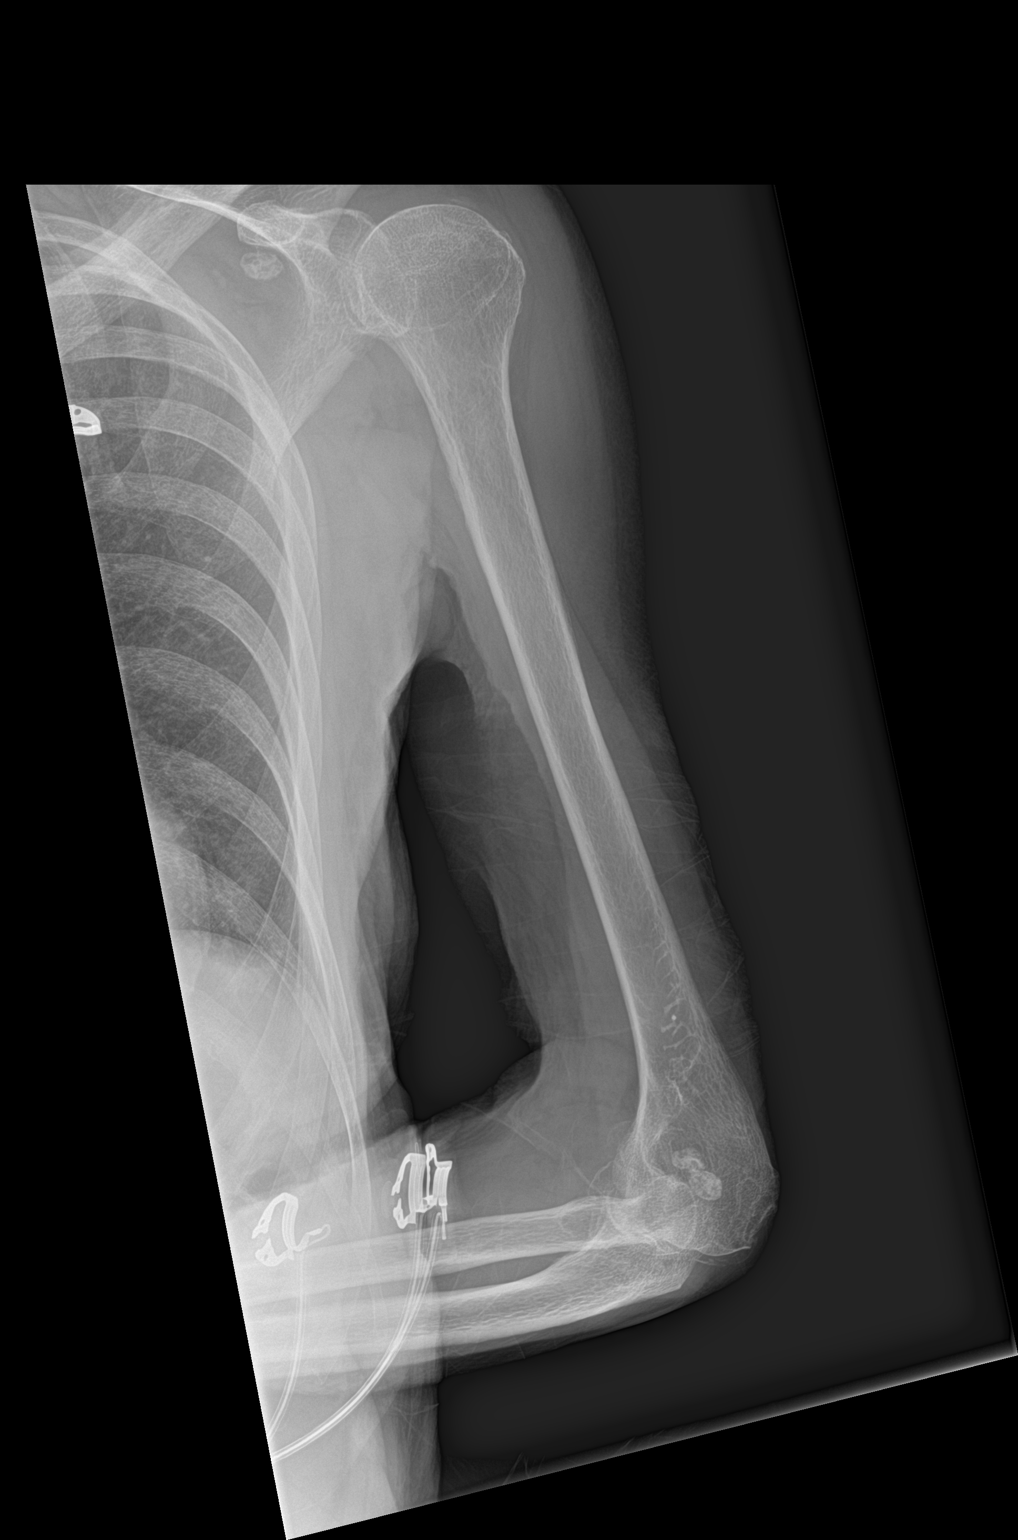

[2 of 2 positions shown; findings below may reference images not displayed]

FINDINGS: Two views of the left humerus submitted. No acute fracture or
subluxation. Mild spurring of humeral head. Mild degenerative
changes glenohumeral joint.
IMPRESSION: No acute fracture or subluxation.  Mild degenerative changes.

## 2023-02-21 ENCOUNTER — Telehealth: Payer: Self-pay | Admitting: Student

## 2023-02-21 NOTE — Telephone Encounter (Signed)
error
